# Patient Record
Sex: Male | Born: 1970 | Race: Black or African American | Hispanic: No | Marital: Married | Smoking: Current every day smoker
Health system: Southern US, Community
[De-identification: ages and names within clinical notes are randomized; demographics above are authoritative.]

## PROBLEM LIST (undated history)

## (undated) DIAGNOSIS — F32A Depression, unspecified: Secondary | ICD-10-CM

## (undated) DIAGNOSIS — I10 Essential (primary) hypertension: Secondary | ICD-10-CM

## (undated) DIAGNOSIS — F1020 Alcohol dependence, uncomplicated: Secondary | ICD-10-CM

## (undated) DIAGNOSIS — F1410 Cocaine abuse, uncomplicated: Secondary | ICD-10-CM

## (undated) DIAGNOSIS — F329 Major depressive disorder, single episode, unspecified: Secondary | ICD-10-CM

## (undated) HISTORY — PX: OTHER SURGICAL HISTORY: SHX169

## (undated) HISTORY — PX: HAND TENDON SURGERY: SHX663

## (undated) HISTORY — PX: FACIAL RECONSTRUCTION SURGERY: SHX631

---

## 2008-06-30 ENCOUNTER — Emergency Department: Payer: Self-pay | Admitting: Emergency Medicine

## 2009-08-24 ENCOUNTER — Emergency Department: Payer: Self-pay | Admitting: Internal Medicine

## 2012-03-09 ENCOUNTER — Emergency Department: Payer: Self-pay | Admitting: Emergency Medicine

## 2012-08-29 ENCOUNTER — Emergency Department (HOSPITAL_BASED_OUTPATIENT_CLINIC_OR_DEPARTMENT_OTHER): Payer: Self-pay

## 2012-08-29 ENCOUNTER — Emergency Department (HOSPITAL_BASED_OUTPATIENT_CLINIC_OR_DEPARTMENT_OTHER)
Admission: EM | Admit: 2012-08-29 | Discharge: 2012-08-29 | Disposition: A | Discharge status: Home / Self Care | Payer: Self-pay | Attending: Emergency Medicine | Admitting: Emergency Medicine

## 2012-08-29 ENCOUNTER — Encounter (HOSPITAL_BASED_OUTPATIENT_CLINIC_OR_DEPARTMENT_OTHER): Payer: Self-pay

## 2012-08-29 DIAGNOSIS — I10 Essential (primary) hypertension: Secondary | ICD-10-CM | POA: Insufficient documentation

## 2012-08-29 DIAGNOSIS — R42 Dizziness and giddiness: Secondary | ICD-10-CM | POA: Insufficient documentation

## 2012-08-29 DIAGNOSIS — F17200 Nicotine dependence, unspecified, uncomplicated: Secondary | ICD-10-CM | POA: Insufficient documentation

## 2012-08-29 DIAGNOSIS — R079 Chest pain, unspecified: Secondary | ICD-10-CM | POA: Insufficient documentation

## 2012-08-29 HISTORY — DX: Essential (primary) hypertension: I10

## 2012-08-29 LAB — CBC WITH DIFFERENTIAL/PLATELET
Basophils Absolute: 0 10*3/uL (ref 0.0–0.1)
Eosinophils Relative: 2 % (ref 0–5)
Lymphocytes Relative: 36 % (ref 12–46)
MCV: 87.6 fL (ref 78.0–100.0)
Platelets: 236 10*3/uL (ref 150–400)
RDW: 12.6 % (ref 11.5–15.5)
WBC: 8.2 10*3/uL (ref 4.0–10.5)

## 2012-08-29 LAB — D-DIMER, QUANTITATIVE: D-Dimer, Quant: <0.27 ug/mL-FEU (ref 0.00–0.48)

## 2012-08-29 LAB — BASIC METABOLIC PANEL
CO2: 23 mEq/L (ref 19–32)
Calcium: 9.6 mg/dL (ref 8.4–10.5)
GFR calc non Af Amer: 81 mL/min — ABNORMAL LOW (ref >90)
Sodium: 140 mEq/L (ref 135–145)

## 2012-08-29 LAB — TROPONIN I: Troponin I: <0.3 ng/mL (ref <0.30)

## 2012-08-29 MED ORDER — HYDROCHLOROTHIAZIDE 12.5 MG PO TABS: 25.0000 mg | ORAL_TABLET | Freq: Every day | ORAL | Status: DC

## 2012-08-29 MED ORDER — LISINOPRIL 5 MG PO TABS: 20.0000 mg | ORAL_TABLET | Freq: Every day | ORAL | Status: DC

## 2012-08-29 MED ORDER — TRAZODONE HCL 100 MG PO TABS: 100.0000 mg | ORAL_TABLET | Freq: Every day | ORAL | Status: AC

## 2012-08-29 NOTE — ED Notes (Signed)
Pt returned from radiology.

## 2012-08-29 NOTE — ED Notes (Addendum)
C/o dizziness that started approx noon-BP ws checked-elevated BP-pt is currently in [facility] for cocaine abuse-pt reports CP earlier-none at present-also requesting med for sleep

## 2012-08-29 NOTE — ED Provider Notes (Addendum)
History     CSN: [id number]  Arrival date & time 08/29/12  1503   First MD Initiated Contact with Patient 08/29/12 1536      Chief Complaint  Patient presents with  . Hypertension    (Consider location/radiation/quality/duration/timing/severity/associated sxs/prior treatment) HPI Comments: Patient comes to the ER for evaluation of high blood pressure. Patient was seen at [facility] earlier today and was found to have high blood pressure. He does have a history of hypertension, has been off all his medications for about a year. Previously took hydrochlorothiazide.  Patient reports that he has had some dizziness today. He also had mild pain in his chest with taking deep breaths earlier. He worked out earlier today, had no pain while working out, the discomfort with breathing occurred afterwards. He does not have any pain at rest. He does not feel short of breath.  Patient reports that he has been under increased stress. He has a history of insomnia hasn't slept well in the last 3 days. Previously took trazodone for this, but has been out of that medicine for a year.  Patient is a 42 y.o. male presenting with hypertension.  Hypertension Associated symptoms include chest pain.    Past Medical History  Diagnosis Date  . Hypertension     Past Surgical History  Procedure Laterality Date  . Arm surgery      No family history on file.  History  Substance Use Topics  . Smoking status: Current Every Day Smoker  . Smokeless tobacco: Not on file  . Alcohol Use: Yes      Review of Systems  Cardiovascular: Positive for chest pain.  Neurological: Positive for dizziness.  All other systems reviewed and are negative.    Allergies  Bee pollen and Penicillins  Home Medications  No current outpatient prescriptions on file.  BP 173/93  Pulse 81  Temp(Src) 98 F (36.7 C) (Oral)  Resp 20  Ht 5\' 8"  (1.727 m)  Wt 188 lb (85.276 kg)  BMI 28.59 kg/m2  SpO2 100%  Physical Exam   Constitutional: He is oriented to person, place, and time. He appears well-developed and well-nourished. No distress.  HENT:  Head: Normocephalic and atraumatic.  Right Ear: Hearing normal.  Nose: Nose normal.  Mouth/Throat: Oropharynx is clear and moist and mucous membranes are normal.  Eyes: Conjunctivae and EOM are normal. Pupils are equal, round, and reactive to light.  Neck: Normal range of motion. Neck supple.  Cardiovascular: Normal rate, regular rhythm, S1 normal and S2 normal.  Exam reveals no gallop and no friction rub.   No murmur heard. Pulmonary/Chest: Effort normal and breath sounds normal. No respiratory distress. He exhibits no tenderness.  Abdominal: Soft. Normal appearance and bowel sounds are normal. There is no hepatosplenomegaly. There is no tenderness. There is no rebound, no guarding, no tenderness at McBurney's point and negative Murphy's sign. No hernia.  Musculoskeletal: Normal range of motion.  Neurological: He is alert and oriented to person, place, and time. He has normal strength. No cranial nerve deficit or sensory deficit. Coordination normal. GCS eye subscore is 4. GCS verbal subscore is 5. GCS motor subscore is 6.  Skin: Skin is warm, dry and intact. No rash noted. No cyanosis.  Psychiatric: He has a normal mood and affect. His speech is normal and behavior is normal. Thought content normal.    ED Course  Procedures (including critical care time)  EKG:  Date: 08/29/2012  Rate: 79  Rhythm: normal sinus rhythm  QRS  Axis: normal  Intervals: normal  ST/T Wave abnormalities: normal  Conduction Disutrbances: none  Narrative Interpretation: unremarkable      Labs Reviewed  BASIC METABOLIC PANEL - Abnormal; Notable for the following:    GFR calc non Af Amer 81 (*)    All other components within normal limits  CBC WITH DIFFERENTIAL  TROPONIN I  D-DIMER, QUANTITATIVE   Dg Chest 2 View  08/29/2012  *RADIOLOGY REPORT*  Clinical Data: Hypertension.   CHEST - 2 VIEW  Comparison: None.  Findings: Heart and mediastinal contours are within normal limits. No focal opacities or effusions.  No acute bony abnormality.  IMPRESSION: No active cardiopulmonary disease.   Original Report Authenticated By: Charlett Nose, M.D.      Diagnosis: Hypertension    MDM  Patient comes to the ER for evaluation of hypertension. Patient had had some dizziness earlier but that has resolved. He also reported slight discomfort in his chest with taking deep breaths. PERC were negative, PE unlikely. Additionally d-dimer was negative. This essentially rules out PE in this patient. Patient is hypertensive, but chest pain symptoms are very atypical for coronary artery disease. EKG was unremarkable and troponin was negative. Patient reassured, no further workup necessary at this time.  Patient does report a previous history of hypertension. It was moderately elevated at arrival, but has slowly come down. He gets recycled at 149/83. Restart blood pressure control with lisinopril-hydrochlorothiazide. Patient also to be given trazodone to be used as needed at nighttime.        Gilda Crease, MD 08/29/12 1656  Gilda Crease, MD 08/29/12 [phone]

## 2012-09-22 ENCOUNTER — Encounter (HOSPITAL_BASED_OUTPATIENT_CLINIC_OR_DEPARTMENT_OTHER): Payer: Self-pay | Admitting: Emergency Medicine

## 2012-09-22 ENCOUNTER — Emergency Department (HOSPITAL_BASED_OUTPATIENT_CLINIC_OR_DEPARTMENT_OTHER)
Admission: EM | Admit: 2012-09-22 | Discharge: 2012-09-22 | Disposition: A | Discharge status: Home / Self Care | Payer: Self-pay | Attending: Emergency Medicine | Admitting: Emergency Medicine

## 2012-09-22 DIAGNOSIS — Z79899 Other long term (current) drug therapy: Secondary | ICD-10-CM | POA: Insufficient documentation

## 2012-09-22 DIAGNOSIS — K089 Disorder of teeth and supporting structures, unspecified: Secondary | ICD-10-CM | POA: Insufficient documentation

## 2012-09-22 DIAGNOSIS — R221 Localized swelling, mass and lump, neck: Secondary | ICD-10-CM | POA: Insufficient documentation

## 2012-09-22 DIAGNOSIS — F329 Major depressive disorder, single episode, unspecified: Secondary | ICD-10-CM | POA: Insufficient documentation

## 2012-09-22 DIAGNOSIS — R220 Localized swelling, mass and lump, head: Secondary | ICD-10-CM | POA: Insufficient documentation

## 2012-09-22 DIAGNOSIS — K0889 Other specified disorders of teeth and supporting structures: Secondary | ICD-10-CM

## 2012-09-22 DIAGNOSIS — F3289 Other specified depressive episodes: Secondary | ICD-10-CM | POA: Insufficient documentation

## 2012-09-22 DIAGNOSIS — I10 Essential (primary) hypertension: Secondary | ICD-10-CM | POA: Insufficient documentation

## 2012-09-22 DIAGNOSIS — F17200 Nicotine dependence, unspecified, uncomplicated: Secondary | ICD-10-CM | POA: Insufficient documentation

## 2012-09-22 HISTORY — DX: Depression, unspecified: F32.A

## 2012-09-22 HISTORY — DX: Major depressive disorder, single episode, unspecified: F32.9

## 2012-09-22 MED ORDER — OXYCODONE-ACETAMINOPHEN 5-325 MG PO TABS
2.0000 | ORAL_TABLET | Freq: Once | ORAL | Status: AC
  Administered 2012-09-22: 2 via ORAL
  Filled 2012-09-22 (×2): qty 2

## 2012-09-22 MED ORDER — CLINDAMYCIN HCL 150 MG PO CAPS: 150.0000 mg | ORAL_CAPSULE | Freq: Four times a day (QID) | ORAL | Status: AC

## 2012-09-22 NOTE — ED Provider Notes (Signed)
History     CSN: [id number]  Arrival date & time 09/22/12  0708   First MD Initiated Contact with Patient 09/22/12 [phone]      Chief Complaint  Patient presents with  . Dental Pain    Patient is a 42 y.o. male presenting with tooth pain. The history is provided by the patient.  Dental PainThe primary symptoms include mouth pain. Primary symptoms do not include fever. The symptoms began 2 days ago. The symptoms are worsening. The symptoms are new. The symptoms occur constantly.  Additional symptoms do not include: facial swelling.    Past Medical History  Diagnosis Date  . Hypertension   . Depression     Past Surgical History  Procedure Laterality Date  . Arm surgery      No family history on file.  History  Substance Use Topics  . Smoking status: Current Every Day Smoker  . Smokeless tobacco: Not on file  . Alcohol Use: Yes      Review of Systems  Constitutional: Negative for fever.  HENT: Negative for facial swelling.     Allergies  Bee pollen and Penicillins  Home Medications   Current Outpatient Rx  Name  Route  Sig  Dispense  Refill  . clindamycin (CLEOCIN) 150 MG capsule   Oral   Take 1 capsule (150 mg total) by mouth every 6 (six) hours.   28 capsule   0   . FLUoxetine HCl (PROZAC PO)   Oral   Take by mouth daily.         . hydrochlorothiazide (HYDRODIURIL) 12.5 MG tablet   Oral   Take 2 tablets (25 mg total) by mouth daily.   90 tablet   2   . lisinopril (PRINIVIL,ZESTRIL) 5 MG tablet   Oral   Take 4 tablets (20 mg total) by mouth daily.   90 tablet   2   . traZODone (DESYREL) 100 MG tablet   Oral   Take 1 tablet (100 mg total) by mouth at bedtime.   30 tablet   2     BP 137/85  Pulse 86  Temp(Src) 97.5 F (36.4 C) (Oral)  Resp 20  SpO2 95%  Physical Exam CONSTITUTIONAL: Well developed/well nourished HEAD AND FACE: Normocephalic/atraumatic EYES: EOMI/PERRL ENMT: Mucous membranes moist. Tender to right upper premolar.   No trismus.  No focal abscess noted. NECK: supple no meningeal signs CV: S1/S2 noted, no murmurs/rubs/gallops noted LUNGS: Lungs are clear to auscultation bilaterally, no apparent distress ABDOMEN: soft, nontender, no rebound or guarding NEURO: Pt is awake/alert, moves all extremitiesx4 EXTREMITIES:full ROM SKIN: warm, color normal  ED Course  Procedures   1. Pain, dental       MDM  Nursing notes including past medical history and social history reviewed and considered in documentation         Joya Gaskins, MD 09/22/12 [phone]

## 2012-09-22 NOTE — ED Notes (Addendum)
Dental pain x 2 days.  Pt from [facility].

## 2012-09-23 ENCOUNTER — Emergency Department (HOSPITAL_BASED_OUTPATIENT_CLINIC_OR_DEPARTMENT_OTHER)
Admission: EM | Admit: 2012-09-23 | Discharge: 2012-09-23 | Disposition: A | Discharge status: Home / Self Care | Payer: Self-pay | Attending: Emergency Medicine | Admitting: Emergency Medicine

## 2012-09-23 ENCOUNTER — Encounter (HOSPITAL_BASED_OUTPATIENT_CLINIC_OR_DEPARTMENT_OTHER): Payer: Self-pay | Admitting: *Deleted

## 2012-09-23 DIAGNOSIS — K047 Periapical abscess without sinus: Secondary | ICD-10-CM

## 2012-09-23 DIAGNOSIS — F3289 Other specified depressive episodes: Secondary | ICD-10-CM | POA: Insufficient documentation

## 2012-09-23 DIAGNOSIS — Z79899 Other long term (current) drug therapy: Secondary | ICD-10-CM | POA: Insufficient documentation

## 2012-09-23 DIAGNOSIS — F17200 Nicotine dependence, unspecified, uncomplicated: Secondary | ICD-10-CM | POA: Insufficient documentation

## 2012-09-23 DIAGNOSIS — K044 Acute apical periodontitis of pulpal origin: Secondary | ICD-10-CM | POA: Insufficient documentation

## 2012-09-23 DIAGNOSIS — F329 Major depressive disorder, single episode, unspecified: Secondary | ICD-10-CM | POA: Insufficient documentation

## 2012-09-23 DIAGNOSIS — K0889 Other specified disorders of teeth and supporting structures: Secondary | ICD-10-CM

## 2012-09-23 DIAGNOSIS — K089 Disorder of teeth and supporting structures, unspecified: Secondary | ICD-10-CM | POA: Insufficient documentation

## 2012-09-23 DIAGNOSIS — F1410 Cocaine abuse, uncomplicated: Secondary | ICD-10-CM | POA: Insufficient documentation

## 2012-09-23 DIAGNOSIS — I10 Essential (primary) hypertension: Secondary | ICD-10-CM | POA: Insufficient documentation

## 2012-09-23 DIAGNOSIS — F1020 Alcohol dependence, uncomplicated: Secondary | ICD-10-CM | POA: Insufficient documentation

## 2012-09-23 HISTORY — DX: Cocaine abuse, uncomplicated: F14.10

## 2012-09-23 HISTORY — DX: Alcohol dependence, uncomplicated: F10.20

## 2012-09-23 MED ORDER — TRAMADOL HCL 50 MG PO TABS: 50.0000 mg | ORAL_TABLET | Freq: Four times a day (QID) | ORAL | Status: AC | PRN

## 2012-09-23 MED ORDER — TRAMADOL HCL 50 MG PO TABS
50.0000 mg | ORAL_TABLET | Freq: Once | ORAL | Status: AC
  Administered 2012-09-23: 50 mg via ORAL
  Filled 2012-09-23: qty 1

## 2012-09-23 NOTE — ED Provider Notes (Signed)
History     CSN: [id number]  Arrival date & time 09/23/12  1149   First MD Initiated Contact with Patient 09/23/12 1208      Chief Complaint  Patient presents with  . Dental Pain    (Consider location/radiation/quality/duration/timing/severity/associated sxs/prior treatment) HPI Comments: 42 year old male presents to the emergency department complaining of right-sided dental pain x3 days. Patient was seen here in the emergency department yesterday and was given a prescription for Vicodin, however he is in a treatment program for narcotic addiction and is not supposed to accept narcotic medications. He was also placed on an antibiotic which he is taking to call the dentist, however states to see him when he was there earlier today. States the pain is worse today than yesterday, described as sharp rated 10 out of 10. Pain worse when trying to chew or with cold exposure. Denies fever, chills, difficulty swallowing.  Patient is a 42 y.o. male presenting with tooth pain. The history is provided by the patient.  Dental PainPrimary symptoms do not include fever.  Additional symptoms do not include: trouble swallowing.    Past Medical History  Diagnosis Date  . Hypertension   . Depression   . Alcoholism   . Cocaine abuse     Past Surgical History  Procedure Laterality Date  . Arm surgery      No family history on file.  History  Substance Use Topics  . Smoking status: Current Every Day Smoker  . Smokeless tobacco: Not on file  . Alcohol Use: Yes      Review of Systems  Constitutional: Negative for fever and chills.  HENT: Positive for dental problem. Negative for trouble swallowing.   All other systems reviewed and are negative.    Allergies  Bee pollen and Penicillins  Home Medications   Current Outpatient Rx  Name  Route  Sig  Dispense  Refill  . clindamycin (CLEOCIN) 150 MG capsule   Oral   Take 1 capsule (150 mg total) by mouth every 6 (six) hours.   28  capsule   0   . FLUoxetine HCl (PROZAC PO)   Oral   Take by mouth daily.         . hydrochlorothiazide (HYDRODIURIL) 12.5 MG tablet   Oral   Take 2 tablets (25 mg total) by mouth daily.   90 tablet   2   . lisinopril (PRINIVIL,ZESTRIL) 5 MG tablet   Oral   Take 4 tablets (20 mg total) by mouth daily.   90 tablet   2   . traMADol (ULTRAM) 50 MG tablet   Oral   Take 1 tablet (50 mg total) by mouth every 6 (six) hours as needed for pain.   15 tablet   0   . traZODone (DESYREL) 100 MG tablet   Oral   Take 1 tablet (100 mg total) by mouth at bedtime.   30 tablet   2     BP 150/97  Pulse 86  Temp(Src) 97.9 F (36.6 C) (Oral)  Resp 22  Wt 188 lb (85.276 kg)  BMI 28.59 kg/m2  SpO2 100%  Physical Exam  Nursing note and vitals reviewed. Constitutional: He is oriented to person, place, and time. He appears well-developed and well-nourished. No distress.  HENT:  Head: Normocephalic and atraumatic. No trismus in the jaw.  Mouth/Throat: Uvula is midline. Abnormal dentition. No dental abscesses.    TTP of upper right last 3 molars with mild surrounding erythema. No edema or abscess.  Eyes: Conjunctivae are normal.  Neck: Normal range of motion. Neck supple.  Cardiovascular: Normal rate, regular rhythm and normal heart sounds.   Pulmonary/Chest: Effort normal and breath sounds normal. No respiratory distress.  Musculoskeletal: Normal range of motion. He exhibits no edema.  Lymphadenopathy:       Head (right side): No submental and no submandibular adenopathy present.       Head (left side): No submental and no submandibular adenopathy present.    He has no cervical adenopathy.  Neurological: He is alert and oriented to person, place, and time.  Skin: Skin is warm and dry. No erythema.  Psychiatric: He has a normal mood and affect. His behavior is normal.    ED Course  Procedures (including critical care time)  Labs Reviewed - No data to display No results  found.   1. Pain, dental   2. Dental infection       MDM   Dental pain associated with dental infection. No evidence of dental abscess. Patient is afebrile, non toxic appearing and swallowing secretions well. I gave patient referral to dentist and stressed the importance of dental follow up for ultimate management of dental pain. I will give him tramadol rather than narcotic pain medication. He is on clindamycin which was prescribed yesterday. Patient voices understanding and is agreeable to plan.         Trevor Mace, PA-C 09/23/12 1229

## 2012-09-23 NOTE — ED Notes (Signed)
Toothache x 3 days. States he has been an inpatient for the past 30 days at [facility] for cocaine and alcohol addiction. Was seen here yesterday and given Vicodin but failed to tell MD he was not suppose to accept narcotics.

## 2012-09-23 NOTE — ED Provider Notes (Signed)
Medical screening examination/treatment/procedure(s) were performed by non-physician practitioner and as supervising physician I was immediately available for consultation/collaboration.   Charles B. Bernette Mayers, MD 09/23/12 [phone]

## 2012-10-04 ENCOUNTER — Encounter (HOSPITAL_BASED_OUTPATIENT_CLINIC_OR_DEPARTMENT_OTHER): Payer: Self-pay | Admitting: *Deleted

## 2012-10-04 ENCOUNTER — Emergency Department (HOSPITAL_BASED_OUTPATIENT_CLINIC_OR_DEPARTMENT_OTHER)
Admission: EM | Admit: 2012-10-04 | Discharge: 2012-10-04 | Disposition: A | Discharge status: Home / Self Care | Payer: Self-pay | Attending: Emergency Medicine | Admitting: Emergency Medicine

## 2012-10-04 DIAGNOSIS — F17200 Nicotine dependence, unspecified, uncomplicated: Secondary | ICD-10-CM | POA: Insufficient documentation

## 2012-10-04 DIAGNOSIS — F1021 Alcohol dependence, in remission: Secondary | ICD-10-CM | POA: Insufficient documentation

## 2012-10-04 DIAGNOSIS — Z79899 Other long term (current) drug therapy: Secondary | ICD-10-CM | POA: Insufficient documentation

## 2012-10-04 DIAGNOSIS — I10 Essential (primary) hypertension: Secondary | ICD-10-CM | POA: Insufficient documentation

## 2012-10-04 DIAGNOSIS — K089 Disorder of teeth and supporting structures, unspecified: Secondary | ICD-10-CM | POA: Insufficient documentation

## 2012-10-04 DIAGNOSIS — F3289 Other specified depressive episodes: Secondary | ICD-10-CM | POA: Insufficient documentation

## 2012-10-04 DIAGNOSIS — R51 Headache: Secondary | ICD-10-CM | POA: Insufficient documentation

## 2012-10-04 DIAGNOSIS — F329 Major depressive disorder, single episode, unspecified: Secondary | ICD-10-CM | POA: Insufficient documentation

## 2012-10-04 DIAGNOSIS — K0889 Other specified disorders of teeth and supporting structures: Secondary | ICD-10-CM

## 2012-10-04 MED ORDER — HYDROCHLOROTHIAZIDE 25 MG PO TABS: 25.0000 mg | ORAL_TABLET | Freq: Every day | ORAL | Status: AC

## 2012-10-04 MED ORDER — TRAMADOL HCL 50 MG PO TABS: 50.0000 mg | ORAL_TABLET | Freq: Four times a day (QID) | ORAL | Status: AC | PRN

## 2012-10-04 NOTE — ED Provider Notes (Signed)
History     CSN: [id number]  Arrival date & time 10/04/12  1332   First MD Initiated Contact with Patient 10/04/12 1347      Chief Complaint  Patient presents with  . Medication Refill    (Consider location/radiation/quality/duration/timing/severity/associated sxs/prior treatment) HPI Comments: Patient comes to the ER with complaints of headaches which he feels is secondary to lisinopril. Patient was placed on lisinopril and hydrochlorothiazide for her previous visit for accelerated hypertension. Patient reports that every time he takes the lisinopril, he gets a headache. He has stopped taking it 2 times over last 2 weeks and on the days that he doesn't take it, the headaches do not occur. He has not had a headache today, hasn't had any in 3 days since he stopped taking the lisinopril. Patient has not had any chest pain, shortness of breath. He does still continue to complain of pain on his right upper molar. Was seen previously, treated with antibiotics and Ultram. He reports that the Ultram helped, but he has run out.   Past Medical History  Diagnosis Date  . Hypertension   . Depression   . Alcoholism   . Cocaine abuse     Past Surgical History  Procedure Laterality Date  . Arm surgery      No family history on file.  History  Substance Use Topics  . Smoking status: Current Every Day Smoker  . Smokeless tobacco: Not on file  . Alcohol Use: Yes      Review of Systems  Constitutional: Negative for fever.  HENT: Positive for dental problem.   Respiratory: Negative for shortness of breath.   Cardiovascular: Negative for chest pain.  Neurological: Positive for headaches.  All other systems reviewed and are negative.    Allergies  Bee pollen and Penicillins  Home Medications   Current Outpatient Rx  Name  Route  Sig  Dispense  Refill  . clindamycin (CLEOCIN) 150 MG capsule   Oral   Take 1 capsule (150 mg total) by mouth every 6 (six) hours.   28 capsule    0   . FLUoxetine HCl (PROZAC PO)   Oral   Take by mouth daily.         . hydrochlorothiazide (HYDRODIURIL) 12.5 MG tablet   Oral   Take 2 tablets (25 mg total) by mouth daily.   90 tablet   2   . lisinopril (PRINIVIL,ZESTRIL) 5 MG tablet   Oral   Take 4 tablets (20 mg total) by mouth daily.   90 tablet   2   . traMADol (ULTRAM) 50 MG tablet   Oral   Take 1 tablet (50 mg total) by mouth every 6 (six) hours as needed for pain.   15 tablet   0   . traZODone (DESYREL) 100 MG tablet   Oral   Take 1 tablet (100 mg total) by mouth at bedtime.   30 tablet   2     BP 143/88  Pulse 74  Temp(Src) 98.3 F (36.8 C) (Oral)  Resp 20  Wt 188 lb (85.276 kg)  BMI 28.59 kg/m2  SpO2 99%  Physical Exam  Constitutional: He is oriented to person, place, and time. He appears well-developed and well-nourished. No distress.  HENT:  Head: Normocephalic and atraumatic.  Right Ear: Hearing normal.  Nose: Nose normal.  Mouth/Throat: Oropharynx is clear and moist and mucous membranes are normal.  Eyes: Conjunctivae and EOM are normal. Pupils are equal, round, and reactive to light.  Neck: Normal range of motion. Neck supple.  Cardiovascular: Normal rate, regular rhythm, S1 normal and S2 normal.  Exam reveals no gallop and no friction rub.   No murmur heard. Pulmonary/Chest: Effort normal and breath sounds normal. No respiratory distress. He exhibits no tenderness.  Abdominal: Soft. Normal appearance and bowel sounds are normal. There is no hepatosplenomegaly. There is no tenderness. There is no rebound, no guarding, no tenderness at McBurney's point and negative Murphy's sign. No hernia.  Musculoskeletal: Normal range of motion.  Neurological: He is alert and oriented to person, place, and time. He has normal strength. No cranial nerve deficit or sensory deficit. Coordination normal. GCS eye subscore is 4. GCS verbal subscore is 5. GCS motor subscore is 6.  Skin: Skin is warm, dry and  intact. No rash noted. No cyanosis.  Psychiatric: He has a normal mood and affect. His speech is normal and behavior is normal. Thought content normal.    ED Course  Procedures (including critical care time)  Labs Reviewed - No data to display No results found.   Diagnoses: 1. Toothache 2. Headaches. Hypertension    MDM  Patient reports intolerance of the lisinopril. Patient has been off of the lisinopril for several days and his blood pressure is only mildly elevated. He is asking if he can stop lisinopril terminally. I have recommended that he stop it for a period of one to 2 weeks and check his blood pressure daily. If his pressure is staying in the 140/90 range, he may need a second agent, in place of the lisinopril. He is to return immediately to the ER his blood pressure is higher than that.        Gilda Crease, MD 10/04/12 1355

## 2012-10-04 NOTE — ED Notes (Addendum)
States we started him on Lisinopril and he is getting headaches from it. Wants to see if it is ok to stop it and take HCTZ only. He also would like a refill on his Tramadol Rx. He is an inpatient at [facility].

## 2015-02-24 ENCOUNTER — Encounter: Payer: Self-pay | Admitting: *Deleted

## 2015-02-24 ENCOUNTER — Emergency Department
Admission: EM | Admit: 2015-02-24 | Discharge: 2015-02-24 | Disposition: A | Discharge status: Home / Self Care | Payer: Self-pay | Attending: Emergency Medicine | Admitting: Emergency Medicine

## 2015-02-24 DIAGNOSIS — Z720 Tobacco use: Secondary | ICD-10-CM | POA: Insufficient documentation

## 2015-02-24 DIAGNOSIS — K649 Unspecified hemorrhoids: Secondary | ICD-10-CM

## 2015-02-24 DIAGNOSIS — Z7952 Long term (current) use of systemic steroids: Secondary | ICD-10-CM | POA: Insufficient documentation

## 2015-02-24 DIAGNOSIS — Z79899 Other long term (current) drug therapy: Secondary | ICD-10-CM | POA: Insufficient documentation

## 2015-02-24 DIAGNOSIS — I10 Essential (primary) hypertension: Secondary | ICD-10-CM | POA: Insufficient documentation

## 2015-02-24 DIAGNOSIS — K644 Residual hemorrhoidal skin tags: Secondary | ICD-10-CM | POA: Insufficient documentation

## 2015-02-24 DIAGNOSIS — Z880 Allergy status to penicillin: Secondary | ICD-10-CM | POA: Insufficient documentation

## 2015-02-24 MED ORDER — HYDROCORTISONE 2.5 % RE CREA
TOPICAL_CREAM | RECTAL | Status: AC
Start: 2015-02-24 — End: 2016-02-24

## 2015-02-24 MED ORDER — OXYCODONE-ACETAMINOPHEN 5-325 MG PO TABS: 1.0000 | ORAL_TABLET | ORAL | Status: AC | PRN

## 2015-02-24 MED ORDER — IBUPROFEN 800 MG PO TABS: 800.0000 mg | ORAL_TABLET | Freq: Three times a day (TID) | ORAL | Status: DC | PRN

## 2015-02-24 MED ORDER — HYDROCORTISONE ACETATE 25 MG RE SUPP: 25.0000 mg | Freq: Two times a day (BID) | RECTAL | Status: AC

## 2015-02-24 NOTE — Discharge Instructions (Signed)
Disposable Sitz Bath °A disposable sitz bath is a plastic basin that fits over the toilet. A bag is hung above the toilet and is connected to a tube that opens into the disposable sitz bath. The bag is filled with warm water that can flow into the basin through the tube.  °HOW TO USE A DISPOSABLE SITZ BATH °· Close the clamp on the tubing before filling the bag with water. This is to prevent leakage. °· Fill the sitz bath basin and the plastic bag with warm water. °· Place the filled basin on the toilet with the seat raised. Make sure the overflow opening is facing toward the back of the toilet. °· Hang the filled plastic bag overhead on a hook or towel rack close to the toilet. When the bag is unclamped, a steady stream of water will flow from the bag, through the tubing, and into the basin. °· Attach the tubing to the opening on the basin. °· Sit on the basin positioned on the toilet seat and release the clamp. This will allow warm water to flush the area around your genitals and anus (perineum). °· Remain sitting on the basin for approximately 15 to 20 minutes. °· Stand up and pat the perineum area dry. If needed, apply clean bandages (dressings) to the affected area. °· Tip the basin into the toilet to remove any remaining water and flush the toilet. °· Wash the basin with warm water and soap. Let it dry in the sink. °· Store the basin and tubing in a clean, dry area. °· Wash your hands with soap and water. °SEEK MEDICAL CARE IF: °You get worse instead of better. Stop the sitz baths if you get worse. °MAKE SURE YOU: °· Understand these instructions. °· Will watch your condition. °· Will get help right away if you are not doing well or get worse. °Document Released: 12/12/2011 Document Revised: 03/06/2012 Document Reviewed: 12/12/2011 °Exit[employer]® Patient Information ©2015 [employer]. This information is not intended to replace advice given to you by your health care provider. Make sure you discuss any questions  you have with your health care provider. ° °Hemorrhoids °Hemorrhoids are swollen veins around the rectum or anus. There are two types of hemorrhoids:  °· Internal hemorrhoids. These occur in the veins just inside the rectum. They may poke through to the outside and become irritated and painful. °· External hemorrhoids. These occur in the veins outside the anus and can be felt as a painful swelling or hard lump near the anus. °CAUSES °· Pregnancy.   °· Obesity.   °· Constipation or diarrhea.   °· Straining to have a bowel movement.   °· Sitting for long periods on the toilet. °· Heavy lifting or other activity that caused you to strain. °· Anal intercourse. °SYMPTOMS  °· Pain.   °· Anal itching or irritation.   °· Rectal bleeding.   °· Fecal leakage.   °· Anal swelling.   °· One or more lumps around the anus.   °DIAGNOSIS  °Your caregiver may be able to diagnose hemorrhoids by visual examination. Other examinations or tests that may be performed include:  °· Examination of the rectal area with a gloved hand (digital rectal exam).   °· Examination of anal canal using a small tube (scope).   °· A blood test if you have lost a significant amount of blood. °· A test to look inside the colon (sigmoidoscopy or colonoscopy). °TREATMENT °Most hemorrhoids can be treated at home. However, if symptoms do not seem to be getting better or if you have   a lot of rectal bleeding, your caregiver may perform a procedure to help make the hemorrhoids get smaller or remove them completely. Possible treatments include:  °· Placing a rubber band at the base of the hemorrhoid to cut off the circulation (rubber band ligation).   °· Injecting a chemical to shrink the hemorrhoid (sclerotherapy).   °· Using a tool to burn the hemorrhoid (infrared light therapy).   °· Surgically removing the hemorrhoid (hemorrhoidectomy).   °· Stapling the hemorrhoid to block blood flow to the tissue (hemorrhoid stapling).   °HOME CARE INSTRUCTIONS  °· Eat foods  with fiber, such as whole grains, beans, nuts, fruits, and vegetables. Ask your doctor about taking products with added fiber in them (fiber supplements). °· Increase fluid intake. Drink enough water and fluids to keep your urine clear or pale yellow.   °· Exercise regularly.   °· Go to the bathroom when you have the urge to have a bowel movement. Do not wait.   °· Avoid straining to have bowel movements.   °· Keep the anal area dry and clean. Use wet toilet paper or moist towelettes after a bowel movement.   °· Medicated creams and suppositories may be used or applied as directed.   °· Only take over-the-counter or prescription medicines as directed by your caregiver.   °· Take warm sitz baths for 15-20 minutes, 3-4 times a day to ease pain and discomfort.   °· Place ice packs on the hemorrhoids if they are tender and swollen. Using ice packs between sitz baths may be helpful.   °¨ Put ice in a plastic bag.   °¨ Place a towel between your skin and the bag.   °¨ Leave the ice on for 15-20 minutes, 3-4 times a day.   °· Do not use a donut-shaped pillow or sit on the toilet for long periods. This increases blood pooling and pain.   °SEEK MEDICAL CARE IF: °· You have increasing pain and swelling that is not controlled by treatment or medicine. °· You have uncontrolled bleeding. °· You have difficulty or you are unable to have a bowel movement. °· You have pain or inflammation outside the area of the hemorrhoids. °MAKE SURE YOU: °· Understand these instructions. °· Will watch your condition. °· Will get help right away if you are not doing well or get worse. °Document Released: 06/09/2000 Document Revised: 05/29/2012 Document Reviewed: 04/16/2012 °ExitCare® Patient Information ©2015 [employer]. This information is not intended to replace advice given to you by your health care provider. Make sure you discuss any questions you have with your health care provider. ° °

## 2015-02-24 NOTE — ED Provider Notes (Signed)
[facility] Emergency Department Provider Note  ____________________________________________  Time seen: Approximately 3:13 PM  I have reviewed the triage vital signs and the nursing notes.   HISTORY  Chief Complaint Hemorrhoids    HPI David Ray is a 44 y.o. male who presents for evaluation of hemorrhoids. She states he's had this for the past 2 days. This is not the first time for his hemorrhoids.   Past Medical History  Diagnosis Date  . Hypertension   . Depression   . Alcoholism   . Cocaine abuse     There are no active problems to display for this patient.   Past Surgical History  Procedure Laterality Date  . Arm surgery    . Hand tendon surgery Left   . Facial reconstruction surgery      Current Outpatient Rx  Name  Route  Sig  Dispense  Refill  . clindamycin (CLEOCIN) 150 MG capsule   Oral   Take 1 capsule (150 mg total) by mouth every 6 (six) hours.   28 capsule   0   . FLUoxetine HCl (PROZAC PO)   Oral   Take by mouth daily.         . hydrochlorothiazide (HYDRODIURIL) 25 MG tablet   Oral   Take 1 tablet (25 mg total) by mouth daily.   90 tablet   2   . hydrocortisone (ANUSOL-HC) 2.5 % rectal cream      Apply rectally 2 times daily   30 g   1   . hydrocortisone (ANUSOL-HC) 25 MG suppository   Rectal   Place 1 suppository (25 mg total) rectally 2 (two) times daily.   12 suppository   0   . ibuprofen (ADVIL,MOTRIN) 800 MG tablet   Oral   Take 1 tablet (800 mg total) by mouth every 8 (eight) hours as needed.   30 tablet   0   . oxyCODONE-acetaminophen (ROXICET) 5-325 MG per tablet   Oral   Take 1-2 tablets by mouth every 4 (four) hours as needed for severe pain.   15 tablet   0   . traMADol (ULTRAM) 50 MG tablet   Oral   Take 1 tablet (50 mg total) by mouth every 6 (six) hours as needed for pain.   15 tablet   0   . traMADol (ULTRAM) 50 MG tablet   Oral   Take 1 tablet (50 mg total) by mouth every  6 (six) hours as needed for pain.   15 tablet   0   . traZODone (DESYREL) 100 MG tablet   Oral   Take 1 tablet (100 mg total) by mouth at bedtime.   30 tablet   2     Allergies Bee pollen and Penicillins  No family history on file.  Social History Social History  Substance Use Topics  . Smoking status: Current Every Day Smoker  . Smokeless tobacco: None  . Alcohol Use: No    Review of Systems Constitutional: No fever/chills Eyes: No visual changes. ENT: No sore throat. Cardiovascular: Denies chest pain. Respiratory: Denies shortness of breath. Gastrointestinal: No abdominal pain.  No nausea, no vomiting.  No diarrhea.  No constipation. Genitourinary: Positive for hemorrhoids Musculoskeletal: Negative for back pain. Skin: Negative for rash. Neurological: Negative for headaches, focal weakness or numbness.  10-point ROS otherwise negative.  ____________________________________________   PHYSICAL EXAM:  VITAL SIGNS: ED Triage Vitals  Enc Vitals Group     BP 02/24/15 1500 168/104 mmHg  Pulse Rate 02/24/15 1500 89     Resp 02/24/15 1500 18     Temp 02/24/15 1500 97.5 F (36.4 C)     Temp Source 02/24/15 1500 Oral     SpO2 02/24/15 1500 98 %     Weight 02/24/15 1500 200 lb (90.719 kg)     Height 02/24/15 1500 5\' 8"  (1.727 m)     Head Cir --      Peak Flow --      Pain Score 02/24/15 1501 8     Pain Loc --      Pain Edu? --      Excl. in GC? --     Constitutional: Alert and oriented. Well appearing and in no acute distress. Eyes: Conjunctivae are normal. PERRL. EOMI. Head: Atraumatic. Nose: No congestion/rhinnorhea. Mouth/Throat: Mucous membranes are moist.  Oropharynx non-erythematous. Neck: No stridor.   Cardiovascular: Normal rate, regular rhythm. Grossly normal heart sounds.  Good peripheral circulation. Respiratory: Normal respiratory effort.  No retractions. Lungs CTAB. Gastrointestinal: Soft and nontender. No distention. No abdominal bruits.  No CVA tenderness. Positive nonthrombosed external hemorrhoid noted. Musculoskeletal: No lower extremity tenderness nor edema.  No joint effusions. Neurologic:  Normal speech and language. No gross focal neurologic deficits are appreciated. No gait instability. Skin:  Skin is warm, dry and intact. No rash noted. Psychiatric: Mood and affect are normal. Speech and behavior are normal.  ____________________________________________   LABS (all labs ordered are listed, but only abnormal results are displayed)  Labs Reviewed - No data to display ____________________________________________    PROCEDURES  Procedure(s) performed: None  Critical Care performed: No  ____________________________________________   INITIAL IMPRESSION / ASSESSMENT AND PLAN / ED COURSE  Pertinent labs & imaging results that were available during my care of the patient were reviewed by me and considered in my medical decision making (see chart for details).  External hemorrhoid. Rx given for Anusol HC cream and suppositories. Patient follow-up with PCP or surgery on-call symptoms get worse. Sitz baths encouraged. Patient voices no other emergency medical complaints at this time. ____________________________________________   FINAL CLINICAL IMPRESSION(S) / ED DIAGNOSES  Final diagnoses:  Hemorrhoids, unspecified hemorrhoid type      Evangeline Dakin, PA-C 02/24/15 1720  Minna Antis, MD 02/24/15 [phone]

## 2015-02-24 NOTE — ED Notes (Signed)
Pt discharged home after verbalizing understanding of discharge instructions; nad noted. 

## 2015-02-24 NOTE — ED Notes (Signed)
Patient c/o painful hemorrhoids for two days. Patients denies bleeding at this time.

## 2017-07-08 ENCOUNTER — Encounter: Payer: Self-pay | Admitting: Emergency Medicine

## 2017-07-08 ENCOUNTER — Emergency Department
Admission: EM | Admit: 2017-07-08 | Discharge: 2017-07-08 | Disposition: A | Discharge status: Other Acute Inpatient Hospital | Attending: Emergency Medicine | Admitting: Emergency Medicine

## 2017-07-08 ENCOUNTER — Emergency Department

## 2017-07-08 ENCOUNTER — Ambulatory Visit (HOSPITAL_COMMUNITY)
Admission: AD | Admit: 2017-07-08 | Discharge: 2017-07-08 | Disposition: A | Discharge status: Home / Self Care | Source: Other Acute Inpatient Hospital | Attending: Emergency Medicine | Admitting: Emergency Medicine

## 2017-07-08 DIAGNOSIS — Z79899 Other long term (current) drug therapy: Secondary | ICD-10-CM | POA: Insufficient documentation

## 2017-07-08 DIAGNOSIS — T79A9XA Traumatic compartment syndrome of other sites, initial encounter: Secondary | ICD-10-CM | POA: Diagnosis not present

## 2017-07-08 DIAGNOSIS — S0993XA Unspecified injury of face, initial encounter: Secondary | ICD-10-CM | POA: Diagnosis present

## 2017-07-08 DIAGNOSIS — S0232XB Fracture of orbital floor, left side, initial encounter for open fracture: Secondary | ICD-10-CM

## 2017-07-08 DIAGNOSIS — Y92149 Unspecified place in prison as the place of occurrence of the external cause: Secondary | ICD-10-CM | POA: Diagnosis not present

## 2017-07-08 DIAGNOSIS — I10 Essential (primary) hypertension: Secondary | ICD-10-CM | POA: Diagnosis not present

## 2017-07-08 DIAGNOSIS — F17200 Nicotine dependence, unspecified, uncomplicated: Secondary | ICD-10-CM | POA: Insufficient documentation

## 2017-07-08 DIAGNOSIS — S0181XA Laceration without foreign body of other part of head, initial encounter: Secondary | ICD-10-CM | POA: Diagnosis not present

## 2017-07-08 DIAGNOSIS — Y999 Unspecified external cause status: Secondary | ICD-10-CM | POA: Insufficient documentation

## 2017-07-08 DIAGNOSIS — H40052 Ocular hypertension, left eye: Secondary | ICD-10-CM | POA: Diagnosis not present

## 2017-07-08 DIAGNOSIS — Y939 Activity, unspecified: Secondary | ICD-10-CM | POA: Insufficient documentation

## 2017-07-08 DIAGNOSIS — S098XXA Other specified injuries of head, initial encounter: Secondary | ICD-10-CM | POA: Insufficient documentation

## 2017-07-08 LAB — ETHANOL

## 2017-07-08 LAB — CBC WITH DIFFERENTIAL/PLATELET
BASOS ABS: 0.1 10*3/uL (ref 0–0.1)
BASOS PCT: 0 %
Eosinophils Absolute: 0 10*3/uL (ref 0–0.7)
Eosinophils Relative: 0 %
HEMATOCRIT: 43.8 % (ref 40.0–52.0)
Hemoglobin: 14.9 g/dL (ref 13.0–18.0)
Lymphocytes Relative: 6 %
Lymphs Abs: 1.1 10*3/uL (ref 1.0–3.6)
MCH: 30.6 pg (ref 26.0–34.0)
MCHC: 34 g/dL (ref 32.0–36.0)
MCV: 89.8 fL (ref 80.0–100.0)
MONO ABS: 0.9 10*3/uL (ref 0.2–1.0)
Monocytes Relative: 4 %
NEUTROS ABS: 18.4 10*3/uL — AB (ref 1.4–6.5)
Neutrophils Relative %: 90 %
PLATELETS: 292 10*3/uL (ref 150–440)
RBC: 4.88 MIL/uL (ref 4.40–5.90)
RDW: 13.8 % (ref 11.5–14.5)
WBC: 20.5 10*3/uL — ABNORMAL HIGH (ref 3.8–10.6)

## 2017-07-08 LAB — COMPREHENSIVE METABOLIC PANEL
ALBUMIN: 4 g/dL (ref 3.5–5.0)
ALK PHOS: 58 U/L (ref 38–126)
ALT: 25 U/L (ref 17–63)
ANION GAP: 9 (ref 5–15)
AST: 30 U/L (ref 15–41)
BILIRUBIN TOTAL: 0.5 mg/dL (ref 0.3–1.2)
BUN: 16 mg/dL (ref 6–20)
CALCIUM: 9.3 mg/dL (ref 8.9–10.3)
CO2: 28 mmol/L (ref 22–32)
Chloride: 101 mmol/L (ref 101–111)
Creatinine, Ser: 1.16 mg/dL (ref 0.61–1.24)
GFR calc Af Amer: >60 mL/min (ref >60)
GFR calc non Af Amer: >60 mL/min (ref >60)
GLUCOSE: 108 mg/dL — AB (ref 65–99)
Potassium: 3.8 mmol/L (ref 3.5–5.1)
Sodium: 138 mmol/L (ref 135–145)
TOTAL PROTEIN: 7.5 g/dL (ref 6.5–8.1)

## 2017-07-08 MED ORDER — OXYCODONE-ACETAMINOPHEN 5-325 MG PO TABS
ORAL_TABLET | ORAL | Status: AC
  Administered 2017-07-08: 2 via ORAL
  Filled 2017-07-08: qty 2

## 2017-07-08 MED ORDER — NICOTINE 7 MG/24HR TD PT24
1.00 | MEDICATED_PATCH | TRANSDERMAL | Status: DC
Start: 2017-07-09 — End: 2017-07-08

## 2017-07-08 MED ORDER — DEXAMETHASONE SODIUM PHOSPHATE 10 MG/ML IJ SOLN
15.0000 mg | Freq: Once | INTRAMUSCULAR | Status: AC
  Administered 2017-07-08: 15 mg via INTRAVENOUS
  Filled 2017-07-08: qty 2

## 2017-07-08 MED ORDER — LIDOCAINE-EPINEPHRINE (PF) 1 %-1:200000 IJ SOLN
INTRAMUSCULAR | Status: AC
  Administered 2017-07-08: 30 mL
  Filled 2017-07-08: qty 30

## 2017-07-08 MED ORDER — ONDANSETRON HCL 4 MG/2ML IJ SOLN
4.0000 mg | Freq: Once | INTRAMUSCULAR | Status: AC
Start: 2017-07-08 — End: 2017-07-08
  Administered 2017-07-08: 4 mg via INTRAVENOUS

## 2017-07-08 MED ORDER — IBUPROFEN 600 MG PO TABS
600.00 mg | ORAL_TABLET | ORAL | Status: DC
Start: ? — End: 2017-07-08

## 2017-07-08 MED ORDER — POLYETHYLENE GLYCOL 3350 17 G PO PACK
17.00 | PACK | ORAL | Status: DC
Start: ? — End: 2017-07-08

## 2017-07-08 MED ORDER — CEFAZOLIN SODIUM-DEXTROSE 1-4 GM/50ML-% IV SOLN
1.0000 g | Freq: Once | INTRAVENOUS | Status: AC
  Administered 2017-07-08: 1 g via INTRAVENOUS

## 2017-07-08 MED ORDER — ONDANSETRON HCL 4 MG/2ML IJ SOLN
INTRAMUSCULAR | Status: AC
  Filled 2017-07-08: qty 2

## 2017-07-08 MED ORDER — OXYCODONE-ACETAMINOPHEN 5-325 MG PO TABS: 2.0000 | ORAL_TABLET | Freq: Once | ORAL | Status: AC

## 2017-07-08 MED ORDER — LIDOCAINE-EPINEPHRINE 1 %-1:100000 IJ SOLN: 10.0000 mL | Freq: Once | INTRAMUSCULAR | Status: DC

## 2017-07-08 MED ORDER — IBUPROFEN 600 MG PO TABS: 600.0000 mg | ORAL_TABLET | Freq: Once | ORAL | Status: AC

## 2017-07-08 MED ORDER — ACETAMINOPHEN 500 MG PO TABS: 1000.0000 mg | ORAL_TABLET | Freq: Once | ORAL | Status: DC

## 2017-07-08 MED ORDER — GENERIC EXTERNAL MEDICATION
30.00 | Status: DC
Start: ? — End: 2017-07-08

## 2017-07-08 MED ORDER — TETRACAINE HCL 0.5 % OP SOLN
2.0000 [drp] | Freq: Once | OPHTHALMIC | Status: AC
  Administered 2017-07-08: 2 [drp] via OPHTHALMIC
  Filled 2017-07-08: qty 4

## 2017-07-08 MED ORDER — SODIUM CHLORIDE 0.9 % IV BOLUS (SEPSIS)
1000.0000 mL | Freq: Once | INTRAVENOUS | Status: AC
  Administered 2017-07-08: 1000 mL via INTRAVENOUS

## 2017-07-08 MED ORDER — CLINDAMYCIN HCL 150 MG PO CAPS
300.00 mg | ORAL_CAPSULE | ORAL | Status: DC
Start: 2017-07-08 — End: 2017-07-08

## 2017-07-08 MED ORDER — CEFAZOLIN SODIUM 1 G IJ SOLR
INTRAMUSCULAR | Status: AC
  Filled 2017-07-08: qty 10

## 2017-07-08 MED ORDER — IBUPROFEN 600 MG PO TABS
ORAL_TABLET | ORAL | Status: AC
  Administered 2017-07-08: 600 mg via ORAL
  Filled 2017-07-08: qty 1

## 2017-07-08 MED ORDER — ACETAMINOPHEN 325 MG PO TABS
650.00 mg | ORAL_TABLET | ORAL | Status: DC
Start: ? — End: 2017-07-08

## 2017-07-08 MED ORDER — IBUPROFEN 600 MG PO TABS: 600.0000 mg | ORAL_TABLET | Freq: Once | ORAL | Status: DC

## 2017-07-08 MED ORDER — ACETAZOLAMIDE SODIUM 500 MG IJ SOLR
500.0000 mg | Freq: Once | INTRAMUSCULAR | Status: AC
Start: 2017-07-08 — End: 2017-07-08
  Administered 2017-07-08: 500 mg via INTRAVENOUS
  Filled 2017-07-08: qty 500

## 2017-07-08 MED ORDER — HYDROCHLOROTHIAZIDE 25 MG PO TABS
25.00 mg | ORAL_TABLET | ORAL | Status: DC
Start: 2017-07-09 — End: 2017-07-08

## 2017-07-08 MED ORDER — LIDOCAINE-EPINEPHRINE (PF) 1 %-1:200000 IJ SOLN
INTRAMUSCULAR | Status: AC
  Filled 2017-07-08: qty 30

## 2017-07-08 MED ORDER — OXYCODONE-ACETAMINOPHEN 5-325 MG PO TABS
1.00 | ORAL_TABLET | ORAL | Status: DC
Start: ? — End: 2017-07-08

## 2017-07-08 NOTE — ED Notes (Signed)
Eye specialist at bedside to perform procedure to reduce pressure
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
## 2017-10-10 ENCOUNTER — Emergency Department
Admission: EM | Admit: 2017-10-10 | Discharge: 2017-10-10 | Disposition: A | Discharge status: Home / Self Care | Attending: Emergency Medicine | Admitting: Emergency Medicine

## 2017-10-10 ENCOUNTER — Other Ambulatory Visit: Payer: Self-pay

## 2017-10-10 DIAGNOSIS — Z79899 Other long term (current) drug therapy: Secondary | ICD-10-CM | POA: Insufficient documentation

## 2017-10-10 DIAGNOSIS — I10 Essential (primary) hypertension: Secondary | ICD-10-CM | POA: Insufficient documentation

## 2017-10-10 DIAGNOSIS — F17200 Nicotine dependence, unspecified, uncomplicated: Secondary | ICD-10-CM | POA: Insufficient documentation

## 2017-10-10 DIAGNOSIS — F1410 Cocaine abuse, uncomplicated: Secondary | ICD-10-CM | POA: Insufficient documentation

## 2017-10-10 LAB — BASIC METABOLIC PANEL
Anion gap: 6 (ref 5–15)
BUN: 22 mg/dL — AB (ref 6–20)
CALCIUM: 9.4 mg/dL (ref 8.9–10.3)
CO2: 24 mmol/L (ref 22–32)
Chloride: 112 mmol/L — ABNORMAL HIGH (ref 101–111)
Creatinine, Ser: 1.45 mg/dL — ABNORMAL HIGH (ref 0.61–1.24)
GFR calc Af Amer: >60 mL/min (ref >60)
GFR, EST NON AFRICAN AMERICAN: 56 mL/min — AB (ref >60)
GLUCOSE: 102 mg/dL — AB (ref 65–99)
Potassium: 3.6 mmol/L (ref 3.5–5.1)
Sodium: 142 mmol/L (ref 135–145)

## 2017-10-10 LAB — CBC WITH DIFFERENTIAL/PLATELET
BASOS ABS: 0.1 10*3/uL (ref 0–0.1)
Basophils Relative: 1 %
EOS PCT: 3 %
Eosinophils Absolute: 0.4 10*3/uL (ref 0–0.7)
HCT: 42.7 % (ref 40.0–52.0)
Hemoglobin: 14.8 g/dL (ref 13.0–18.0)
LYMPHS ABS: 1.3 10*3/uL (ref 1.0–3.6)
LYMPHS PCT: 11 %
MCH: 31.2 pg (ref 26.0–34.0)
MCHC: 34.6 g/dL (ref 32.0–36.0)
MCV: 90.3 fL (ref 80.0–100.0)
MONO ABS: 0.8 10*3/uL (ref 0.2–1.0)
Monocytes Relative: 7 %
Neutro Abs: 9 10*3/uL — ABNORMAL HIGH (ref 1.4–6.5)
Neutrophils Relative %: 78 %
PLATELETS: 308 10*3/uL (ref 150–440)
RBC: 4.73 MIL/uL (ref 4.40–5.90)
RDW: 13.8 % (ref 11.5–14.5)
WBC: 11.6 10*3/uL — ABNORMAL HIGH (ref 3.8–10.6)

## 2017-10-10 LAB — TROPONIN I: Troponin I: <0.03 ng/mL (ref <0.03)

## 2017-10-10 NOTE — ED Triage Notes (Signed)
Pt comes via [facility] with c/o wanting detox from crack. Pt was picked up at parking lot and stated he wanted detox. Pt admits to using crack last night and has used about 3 oz in the last 9 days and hasn't slept. Pt is alert. Pt appears in NAD. VSS

## 2017-10-10 NOTE — ED Notes (Signed)
Pt given food and water

## 2017-10-10 NOTE — ED Provider Notes (Signed)
[facility] Emergency Department Provider Note  ____________________________________________   I have reviewed the triage vital signs and the nursing notes.   HISTORY  Chief Complaint Drug Problem   History limited by: Not Limited   HPI David Ray is a 47 y.o. male who presents to the emergency department today via EMS because of desire for detox. States that he has been abusing cocaine. Last used yesterday. Has long history of cocaine abuse. Apparently police were on scene and patient stated he wanted to come to the hospital for detox. Last attempted detox roughly 3 years ago and was sober for roughly 6 months. Also complaining of depression, denies any suicidal ideation. Stated he was told to follow up with [facility] which he did not do. Complaining of diffuse pain.    Per medical record review patient has a history of cocaine abuse, depression.  Past Medical History:  Diagnosis Date  . Alcoholism (HCC)   . Cocaine abuse (HCC)   . Depression   . Hypertension     There are no active problems to display for this patient.   Past Surgical History:  Procedure Laterality Date  . arm surgery    . FACIAL RECONSTRUCTION SURGERY    . HAND TENDON SURGERY Left     Prior to Admission medications   Medication Sig Start Date End Date Taking? Authorizing Provider  clindamycin (CLEOCIN) 150 MG capsule Take 1 capsule (150 mg total) by mouth every 6 (six) hours. 09/22/12   Zadie Rhine, MD  FLUoxetine HCl (PROZAC PO) Take by mouth daily.    [provider]  hydrochlorothiazide (HYDRODIURIL) 25 MG tablet Take 1 tablet (25 mg total) by mouth daily. 10/04/12   Gilda Crease, MD  hydrocortisone (ANUSOL-HC) 25 MG suppository Place 1 suppository (25 mg total) rectally 2 (two) times daily. 02/24/15   Beers, Charmayne Sheer, PA-C  ibuprofen (ADVIL,MOTRIN) 800 MG tablet Take 1 tablet (800 mg total) by mouth every 8 (eight) hours as needed. 02/24/15   Beers,  Charmayne Sheer, PA-C  oxyCODONE-acetaminophen (ROXICET) 5-325 MG per tablet Take 1-2 tablets by mouth every 4 (four) hours as needed for severe pain. 02/24/15   Beers, Charmayne Sheer, PA-C  traMADol (ULTRAM) 50 MG tablet Take 1 tablet (50 mg total) by mouth every 6 (six) hours as needed for pain. 09/23/12   Hess, Nada Boozer, PA-C  traMADol (ULTRAM) 50 MG tablet Take 1 tablet (50 mg total) by mouth every 6 (six) hours as needed for pain. 10/04/12   Gilda Crease, MD  traZODone (DESYREL) 100 MG tablet Take 1 tablet (100 mg total) by mouth at bedtime. 08/29/12   Gilda Crease, MD  lisinopril (PRINIVIL,ZESTRIL) 5 MG tablet Take 4 tablets (20 mg total) by mouth daily. 08/29/12 10/04/12  Gilda Crease, MD    Allergies Bee pollen and Penicillins  No family history on file.  Social History Social History   Tobacco Use  . Smoking status: Current Every Day Smoker  . Smokeless tobacco: Never Used  Substance Use Topics  . Alcohol use: Yes    Comment: occ  . Drug use: Yes    Types: Cocaine    Review of Systems Constitutional: No fever/chills Eyes: No visual changes. ENT: No sore throat. Cardiovascular: Positive for chest pain. Respiratory: Denies shortness of breath. Gastrointestinal: Positive for abdominal pain. Genitourinary: Negative for dysuria. Musculoskeletal: Positive for extremity pain. Skin: Negative for rash. Neurological: Negative for headaches, focal weakness or numbness.  ____________________________________________   PHYSICAL  EXAM:  VITAL SIGNS: ED Triage Vitals  Enc Vitals Group     BP 10/10/17 1537 (!) 147/99     Pulse Rate 10/10/17 1537 93     Resp 10/10/17 1537 20     Temp 10/10/17 1537 98.4 F (36.9 C)     Temp Source 10/10/17 1537 Oral     SpO2 10/10/17 1537 96 %     Weight 10/10/17 1537 175 lb (79.4 kg)     Height 10/10/17 1537 5\' 8"  (1.727 m)     Head Circumference --      Peak Flow --      Pain Score 10/10/17 1538 0   Constitutional: Awake  and alert. Oriented.  Slightly intoxicated appearing.  Eyes: Conjunctivae are normal.  ENT   Head: Normocephalic and atraumatic.   Nose: No congestion/rhinnorhea.   Mouth/Throat: Mucous membranes are moist.   Neck: No stridor. Hematological/Lymphatic/Immunilogical: No cervical lymphadenopathy. Cardiovascular: Normal rate, regular rhythm.  No murmurs, rubs, or gallops.  Respiratory: Normal respiratory effort without tachypnea nor retractions. Breath sounds are clear and equal bilaterally. No wheezes/rales/rhonchi. Gastrointestinal: Soft and non tender. No rebound. No guarding.  Genitourinary: Deferred Musculoskeletal: Normal range of motion in all extremities. No lower extremity edema. Neurologic:  Normal speech and language. No gross focal neurologic deficits are appreciated.  Skin:  Skin is warm, dry and intact. No rash noted. Psychiatric: Slightly intoxicated appearing.  ____________________________________________    LABS (pertinent positives/negatives)  Trop <0.03 BMP na 142, k 3.6, cr 1.45 CBC wbc 11.6, hgb 14.8, plt 308  ____________________________________________   EKG  I, Phineas SemenGraydon Claudette Wermuth, attending physician, personally viewed and interpreted this EKG  EKG Time: 1548 Rate: 81 Rhythm: normal sinus rhythm Axis: normal Intervals: qtc 436 QRS: narrow, q waves III ST changes: no st elevation, t wave inversion II, III, Avf Impression: abnormal ekg   ____________________________________________    RADIOLOGY  None  ____________________________________________   PROCEDURES  Procedures  ____________________________________________   INITIAL IMPRESSION / ASSESSMENT AND PLAN / ED COURSE  Pertinent labs & imaging results that were available during my care of the patient were reviewed by me and considered in my medical decision making (see chart for details).  Patient presented to the emergency department today requesting detox from cocaine.  He  had also been complaining of diffuse pain as well as some intermittent chest pain.  EKG did show some inverted T waves.  Troponin however was negative.  Patient without any chest pain here in the emergency department.  Did discuss with patient importance of cocaine cessation will give [facility] and [facility] information.  Additionally discussed importance of following up.    ____________________________________________   FINAL CLINICAL IMPRESSION(S) / ED DIAGNOSES  Final diagnoses:  Cocaine abuse (HCC)     Note: This dictation was prepared with Dragon dictation. Any transcriptional errors that result from this process are unintentional     Phineas SemenGoodman, Earnest Thalman, MD 10/10/17 1939

## 2017-10-10 NOTE — ED Notes (Signed)
PT VOLUNTARY 

## 2017-10-10 NOTE — Discharge Instructions (Addendum)
Please seek medical attention for any high fevers, chest pain, shortness of breath, change in behavior, persistent vomiting, bloody stool or any other new or concerning symptoms.  

## 2017-10-10 NOTE — ED Notes (Addendum)
Pt ambulatory with steady gait. Pt given meal tray.

## 2019-05-23 ENCOUNTER — Encounter: Payer: Self-pay | Admitting: Emergency Medicine

## 2019-05-23 ENCOUNTER — Other Ambulatory Visit: Payer: Self-pay

## 2019-05-23 ENCOUNTER — Emergency Department
Admission: EM | Admit: 2019-05-23 | Discharge: 2019-05-23 | Disposition: A | Discharge status: Home / Self Care | Attending: Emergency Medicine | Admitting: Emergency Medicine

## 2019-05-23 ENCOUNTER — Emergency Department

## 2019-05-23 DIAGNOSIS — Z20828 Contact with and (suspected) exposure to other viral communicable diseases: Secondary | ICD-10-CM | POA: Insufficient documentation

## 2019-05-23 DIAGNOSIS — Z79899 Other long term (current) drug therapy: Secondary | ICD-10-CM | POA: Diagnosis not present

## 2019-05-23 DIAGNOSIS — F17200 Nicotine dependence, unspecified, uncomplicated: Secondary | ICD-10-CM | POA: Diagnosis not present

## 2019-05-23 DIAGNOSIS — I10 Essential (primary) hypertension: Secondary | ICD-10-CM | POA: Insufficient documentation

## 2019-05-23 DIAGNOSIS — R05 Cough: Secondary | ICD-10-CM | POA: Diagnosis present

## 2019-05-23 DIAGNOSIS — R0602 Shortness of breath: Secondary | ICD-10-CM | POA: Diagnosis not present

## 2019-05-23 DIAGNOSIS — J069 Acute upper respiratory infection, unspecified: Secondary | ICD-10-CM | POA: Insufficient documentation

## 2019-05-23 LAB — CBC
HCT: 43.7 % (ref 39.0–52.0)
Hemoglobin: 15.4 g/dL (ref 13.0–17.0)
MCH: 30 pg (ref 26.0–34.0)
MCHC: 35.2 g/dL (ref 30.0–36.0)
MCV: 85.2 fL (ref 80.0–100.0)
Platelets: 298 10*3/uL (ref 150–400)
RBC: 5.13 MIL/uL (ref 4.22–5.81)
RDW: 13.2 % (ref 11.5–15.5)
WBC: 10.3 10*3/uL (ref 4.0–10.5)
nRBC: 0 % (ref 0.0–0.2)

## 2019-05-23 LAB — COMPREHENSIVE METABOLIC PANEL
ALT: 34 U/L (ref 0–44)
AST: 31 U/L (ref 15–41)
Albumin: 3.6 g/dL (ref 3.5–5.0)
Alkaline Phosphatase: 57 U/L (ref 38–126)
Anion gap: 9 (ref 5–15)
BUN: 18 mg/dL (ref 6–20)
CO2: 22 mmol/L (ref 22–32)
Calcium: 9.5 mg/dL (ref 8.9–10.3)
Chloride: 107 mmol/L (ref 98–111)
Creatinine, Ser: 1.19 mg/dL (ref 0.61–1.24)
GFR calc Af Amer: >60 mL/min (ref >60)
GFR calc non Af Amer: >60 mL/min (ref >60)
Glucose, Bld: 110 mg/dL — ABNORMAL HIGH (ref 70–99)
Potassium: 3.3 mmol/L — ABNORMAL LOW (ref 3.5–5.1)
Sodium: 138 mmol/L (ref 135–145)
Total Bilirubin: 0.4 mg/dL (ref 0.3–1.2)
Total Protein: 7.2 g/dL (ref 6.5–8.1)

## 2019-05-23 LAB — INFLUENZA PANEL BY PCR (TYPE A & B)
Influenza A By PCR: NEGATIVE
Influenza B By PCR: NEGATIVE

## 2019-05-23 MED ORDER — SODIUM CHLORIDE 0.9 % IV BOLUS
500.0000 mL | Freq: Once | INTRAVENOUS | Status: AC
Start: 2019-05-23 — End: 2019-05-23
  Administered 2019-05-23: 19:00:00 500 mL via INTRAVENOUS

## 2019-05-23 MED ORDER — ACETAMINOPHEN 500 MG PO TABS: 500.0000 mg | ORAL_TABLET | Freq: Four times a day (QID) | ORAL | 0 refills | Status: AC | PRN

## 2019-05-23 MED ORDER — ALBUTEROL SULFATE HFA 108 (90 BASE) MCG/ACT IN AERS: 2.0000 | INHALATION_SPRAY | Freq: Four times a day (QID) | RESPIRATORY_TRACT | 0 refills | Status: AC | PRN

## 2019-05-23 MED ORDER — ONDANSETRON HCL 4 MG/2ML IJ SOLN
4.0000 mg | Freq: Once | INTRAMUSCULAR | Status: AC
  Administered 2019-05-23: 19:00:00 4 mg via INTRAVENOUS
  Filled 2019-05-23: qty 2

## 2019-05-23 MED ORDER — IPRATROPIUM-ALBUTEROL 0.5-2.5 (3) MG/3ML IN SOLN
3.0000 mL | Freq: Once | RESPIRATORY_TRACT | Status: AC
  Administered 2019-05-23: 19:00:00 3 mL via RESPIRATORY_TRACT
  Filled 2019-05-23: qty 3

## 2019-05-23 MED ORDER — POTASSIUM CHLORIDE CRYS ER 20 MEQ PO TBCR
40.0000 meq | EXTENDED_RELEASE_TABLET | Freq: Once | ORAL | Status: AC
  Administered 2019-05-23: 40 meq via ORAL
  Filled 2019-05-23: qty 2

## 2019-05-23 MED ORDER — BENZONATATE 100 MG PO CAPS: 100.0000 mg | ORAL_CAPSULE | Freq: Three times a day (TID) | ORAL | 0 refills | Status: AC | PRN

## 2019-05-23 NOTE — ED Notes (Signed)
Pt c/o cough and not feeling well x3 days. Pt states he has had 2 episodes of emesis in the last 24 hours. Pt denies any diarrhea.

## 2019-05-23 NOTE — Discharge Instructions (Addendum)
There is no pneumonia on your chest x-ray.  Your lab work is all reassuring.  You can take Tessalon Perles for cough.  Take Tylenol for fever and body aches.  You can use albuterol inhaler for any shortness of breath.  Please return the emergency department over the weekend for any worsening of symptoms.  Please call primary care on Monday to establish care.

## 2019-05-23 NOTE — ED Triage Notes (Signed)
Pt presents to ED via POV with c/o fever and cough x 2 days. Pt also c/o general malaise x 2 days. Pt is A&O x4, NAD noted in triage. VSS. Pt c/o coughing up yellow phlegm in the last 2 days.

## 2019-05-23 NOTE — ED Provider Notes (Signed)
[facility] Emergency Department Provider Note  ____________________________________________  Time seen: Approximately 6:12 PM  I have reviewed the triage vital signs and the nursing notes.   HISTORY  Chief Complaint Fever and Cough    HPI David Ray is a 48 y.o. male that presents to the emergency department for evaluation of chills, nasal congestion, sore throat, productive cough with yellow sputum, and shortness of breath, body aches for 2 days. He has had 2 episodes of vomiting. Patient does not have a thermometer so he has not checked his temperature but states that he is burning up and then will have chills.  No sick contacts. He recently got out of prison. No headache,  diarrhea.   Past Medical History:  Diagnosis Date  . Alcoholism (HCC)   . Cocaine abuse (HCC)   . Depression   . Hypertension     There are no active problems to display for this patient.   Past Surgical History:  Procedure Laterality Date  . arm surgery    . FACIAL RECONSTRUCTION SURGERY    . HAND TENDON SURGERY Left     Prior to Admission medications   Medication Sig Start Date End Date Taking? Authorizing Provider  acetaminophen (TYLENOL) 500 MG tablet Take 1 tablet (500 mg total) by mouth every 6 (six) hours as needed. 05/23/19   Enid DerryWagner, Payzlee Ryder, PA-C  albuterol (VENTOLIN HFA) 108 (90 Base) MCG/ACT inhaler Inhale 2 puffs into the lungs every 6 (six) hours as needed for wheezing or shortness of breath. 05/23/19   Enid DerryWagner, Jaivion Kingsley, PA-C  benzonatate (TESSALON PERLES) 100 MG capsule Take 1 capsule (100 mg total) by mouth 3 (three) times daily as needed. 05/23/19 05/22/20  Enid DerryWagner, Ouita Nish, PA-C  clindamycin (CLEOCIN) 150 MG capsule Take 1 capsule (150 mg total) by mouth every 6 (six) hours. 09/22/12   Zadie RhineWickline, Donald, MD  FLUoxetine HCl (PROZAC PO) Take by mouth daily.    [provider]  hydrochlorothiazide (HYDRODIURIL) 25 MG tablet Take 1 tablet (25 mg total) by  mouth daily. 10/04/12   Gilda CreasePollina, Christopher J, MD  hydrocortisone (ANUSOL-HC) 25 MG suppository Place 1 suppository (25 mg total) rectally 2 (two) times daily. 02/24/15   Beers, Charmayne Sheerharles M, PA-C  ibuprofen (ADVIL,MOTRIN) 800 MG tablet Take 1 tablet (800 mg total) by mouth every 8 (eight) hours as needed. 02/24/15   Beers, Charmayne Sheerharles M, PA-C  oxyCODONE-acetaminophen (ROXICET) 5-325 MG per tablet Take 1-2 tablets by mouth every 4 (four) hours as needed for severe pain. 02/24/15   Beers, Charmayne Sheerharles M, PA-C  traMADol (ULTRAM) 50 MG tablet Take 1 tablet (50 mg total) by mouth every 6 (six) hours as needed for pain. 09/23/12   Hess, Nada Boozerobyn M, PA-C  traMADol (ULTRAM) 50 MG tablet Take 1 tablet (50 mg total) by mouth every 6 (six) hours as needed for pain. 10/04/12   Gilda CreasePollina, Christopher J, MD  traZODone (DESYREL) 100 MG tablet Take 1 tablet (100 mg total) by mouth at bedtime. 08/29/12   Gilda CreasePollina, Christopher J, MD    Allergies Bee pollen and Penicillins  No family history on file.  Social History Social History   Tobacco Use  . Smoking status: Current Every Day Smoker  . Smokeless tobacco: Never Used  Substance Use Topics  . Alcohol use: Yes    Comment: occ  . Drug use: Yes    Types: Cocaine     Review of Systems  Constitutional: Positive for chills. Eyes: No visual changes. No discharge. ENT: Positive for  congestion and rhinorrhea. Cardiovascular: No chest pain. Respiratory: Positive for cough and SOB. Gastrointestinal: No abdominal pain.  Positive for nausea and vomiting.  No diarrhea.  No constipation. Musculoskeletal: Positive for body aches. Skin: Negative for rash, abrasions, lacerations, ecchymosis. Neurological: Negative for headaches.   ____________________________________________   PHYSICAL EXAM:  VITAL SIGNS: ED Triage Vitals  Enc Vitals Group     BP 05/23/19 1659 (!) 143/94     Pulse Rate 05/23/19 1659 95     Resp 05/23/19 1659 (!) 22     Temp 05/23/19 1659 99 F (37.2 C)      Temp Source 05/23/19 1659 Oral     SpO2 05/23/19 1659 97 %     Weight 05/23/19 1700 220 lb (99.8 kg)     Height 05/23/19 1700 5\' 8"  (1.727 m)     Head Circumference --      Peak Flow --      Pain Score 05/23/19 1700 6     Pain Loc --      Pain Edu? --      Excl. in [place]? --      Constitutional: Alert and oriented. Well appearing and in no acute distress. Eyes: Conjunctivae are normal. PERRL. EOMI. No discharge. Head: Atraumatic. ENT: No frontal and maxillary sinus tenderness.      Ears: Tympanic membranes pearly gray with good landmarks. No discharge.      Nose: Mild congestion/rhinnorhea.      Mouth/Throat: Mucous membranes are moist. Oropharynx non-erythematous. Tonsils not enlarged. No exudates. Uvula midline. Neck: No stridor.   Hematological/Lymphatic/Immunilogical: No cervical lymphadenopathy. Cardiovascular: Normal rate, regular rhythm.  Good peripheral circulation. Respiratory: Normal respiratory effort without tachypnea or retractions. Lungs CTAB. Good air entry to the bases with no decreased or absent breath sounds. Gastrointestinal: Bowel sounds 4 quadrants. Soft and nontender to palpation. No guarding or rigidity. No palpable masses. No distention. Musculoskeletal: Full range of motion to all extremities. No gross deformities appreciated. Neurologic:  Normal speech and language. No gross focal neurologic deficits are appreciated.  Skin:  Skin is warm, dry and intact. No rash noted. Psychiatric: Mood and affect are normal. Speech and behavior are normal. Patient exhibits appropriate insight and judgement.   ____________________________________________   LABS (all labs ordered are listed, but only abnormal results are displayed)  Labs Reviewed  COMPREHENSIVE METABOLIC PANEL - Abnormal; Notable for the following components:      Result Value   Potassium 3.3 (*)    Glucose, Bld 110 (*)    All other components within normal limits  SARS CORONAVIRUS 2 (TAT 6-24 HRS)   CBC  INFLUENZA PANEL BY PCR (TYPE A & B)   ____________________________________________  EKG   ____________________________________________  RADIOLOGY Robinette Haines, personally viewed and evaluated these images (plain radiographs) as part of my medical decision making, as well as reviewing the written report by the radiologist.  Dg Chest Portable 1 View  Result Date: 05/23/2019 CLINICAL DATA:  Cough and shortness of breath. EXAM: PORTABLE CHEST 1 VIEW COMPARISON:  August 29, 2012 FINDINGS: The heart size and mediastinal contours are within normal limits. Both lungs are clear. The visualized skeletal structures are unremarkable. IMPRESSION: No active disease. Electronically Signed   By: Dorise Bullion III M.D   On: 05/23/2019 17:43    ____________________________________________    PROCEDURES  Procedure(s) performed:    Procedures    Medications  ipratropium-albuterol (DUONEB) 0.5-2.5 (3) MG/3ML nebulizer solution 3 mL (3 mLs Nebulization Given 05/23/19 1849)  sodium  chloride 0.9 % bolus 500 mL (0 mLs Intravenous Stopped 05/23/19 2040)  ondansetron (ZOFRAN) injection 4 mg (4 mg Intravenous Given 05/23/19 1849)  potassium chloride SA (KLOR-CON) CR tablet 40 mEq (40 mEq Oral Given 05/23/19 2009)     ____________________________________________   INITIAL IMPRESSION / ASSESSMENT AND PLAN / ED COURSE  Pertinent labs & imaging results that were available during my care of the patient were reviewed by me and considered in my medical decision making (see chart for details).  Review of the [place] CSRS was performed in accordance of the [facility] prior to dispensing any controlled drugs.     Patient's diagnosis is consistent with viral URI. Vital signs and exam are reassuring.  Chest x-ray negative for acute cardiopulmonary processes.  Lab work all very reassuring.  Potassium was mildly low at 3.3 and was supplemented.  EKG shows normal sinus rhythm.  Influenza test is negative.   Covid test is pending.  Shortness of breath completely resolved with 1 DuoNeb treatment.  Patient appears well and is staying well hydrated.  Patient feels comfortable going home. Patient will be discharged home with prescriptions for albuterol, Tessalon Perles, Tylenol. Patient is to follow up with primary care as needed or otherwise directed. Patient is given ED precautions to return to the ED for any worsening or new symptoms.  David Ray was evaluated in Emergency Department on 05/23/2019 for the symptoms described in the history of present illness. He was evaluated in the context of the global COVID-19 pandemic, which necessitated consideration that the patient might be at risk for infection with the SARS-CoV-2 virus that causes COVID-19. Institutional protocols and algorithms that pertain to the evaluation of patients at risk for COVID-19 are in a state of rapid change based on information released by regulatory bodies including the CDC and federal and state organizations. These policies and algorithms were followed during the patient's care in the ED.   ____________________________________________  FINAL CLINICAL IMPRESSION(S) / ED DIAGNOSES  Final diagnoses:  Viral URI with cough      NEW MEDICATIONS STARTED DURING THIS VISIT:  ED Discharge Orders         Ordered    albuterol (VENTOLIN HFA) 108 (90 Base) MCG/ACT inhaler  Every 6 hours PRN     05/23/19 2034    acetaminophen (TYLENOL) 500 MG tablet  Every 6 hours PRN     05/23/19 2034    benzonatate (TESSALON PERLES) 100 MG capsule  3 times daily PRN     05/23/19 2034              This chart was dictated using voice recognition software/Dragon. Despite best efforts to proofread, errors can occur which can change the meaning. Any change was purely unintentional.    Enid Derry, PA-C 05/23/19 2350    Minna Antis, MD 05/24/19 [id number]

## 2019-05-24 LAB — SARS CORONAVIRUS 2 (TAT 6-24 HRS): SARS Coronavirus 2: NEGATIVE

## 2020-10-04 ENCOUNTER — Other Ambulatory Visit: Payer: Self-pay

## 2020-10-04 ENCOUNTER — Ambulatory Visit
Admission: EM | Admit: 2020-10-04 | Discharge: 2020-10-04 | Disposition: A | Discharge status: Home / Self Care | Payer: Medicaid Other

## 2020-10-04 ENCOUNTER — Ambulatory Visit (INDEPENDENT_AMBULATORY_CARE_PROVIDER_SITE_OTHER): Payer: Medicaid Other

## 2020-10-04 ENCOUNTER — Encounter: Payer: Self-pay | Admitting: Emergency Medicine

## 2020-10-04 DIAGNOSIS — M25512 Pain in left shoulder: Secondary | ICD-10-CM

## 2020-10-04 DIAGNOSIS — I10 Essential (primary) hypertension: Secondary | ICD-10-CM

## 2020-10-04 DIAGNOSIS — S4992XA Unspecified injury of left shoulder and upper arm, initial encounter: Secondary | ICD-10-CM

## 2020-10-04 DIAGNOSIS — S46912A Strain of unspecified muscle, fascia and tendon at shoulder and upper arm level, left arm, initial encounter: Secondary | ICD-10-CM

## 2020-10-04 MED ORDER — IBUPROFEN 800 MG PO TABS: 800.0000 mg | ORAL_TABLET | Freq: Three times a day (TID) | ORAL | 0 refills | Status: AC | PRN

## 2020-10-04 MED ORDER — CYCLOBENZAPRINE HCL 10 MG PO TABS: 10.0000 mg | ORAL_TABLET | Freq: Every evening | ORAL | 0 refills | Status: AC | PRN

## 2020-10-04 MED ORDER — KETOROLAC TROMETHAMINE 60 MG/2ML IM SOLN
60.0000 mg | Freq: Once | INTRAMUSCULAR | Status: AC
  Administered 2020-10-04: 60 mg via INTRAMUSCULAR

## 2020-10-04 NOTE — Discharge Instructions (Addendum)
SPRAIN/STRAIN: Stressed avoiding painful activities . Reviewed RICE guidelines. Use medications as directed, including NSAIDs. If no NSAIDs have been prescribed for you today, you may take Aleve or Motrin over the counter. May use Tylenol in between doses of NSAIDs.  If no improvement in the next 1-2 weeks, f/u with PCP or return to our office for reexamination, and please feel free to call or return at any time for any questions or concerns you may have and we will be happy to help you!      You may have a condition requiring you to follow up with Orthopedics so please call one of the following office for appointment:   [facility] [address] [place], [place] [address] Phone: [phone]  [facility] [address] [place], [place] [address] Phone: [phone]   HYPERTENSION: BP elevated in clinic. Keep log and f/u with PCP for possible medication adjustments.

## 2020-10-04 NOTE — ED Triage Notes (Signed)
Pt is present today with a left shoulder injury. Pt states that he yanked billy goat out the way and when he yanked it out the way he heard a pop in the left shoulder. Pt states that he is not able to rotate his shoulder. Pt states without any shoulder movement his pain is around a 7 ir he moves it the pain shoots to a 10.

## 2020-10-04 NOTE — ED Provider Notes (Signed)
[facility]    CSN: [id number] Arrival date & time: 10/04/20  1533      History   Chief Complaint Chief Complaint  Patient presents with  . Shoulder Injury    HPI David Ray is a 50 y.o. right-handed male presenting for left shoulder pain following an injury today.  Patient states that he was using a piece of equipment and yanked on it.  He says that he felt a sharp pain in his shoulder.  He says that a friend moved his shoulder around when he felt a "pop."  He says that he thinks his shoulder might have been out of socket and his friend put it back into place.  He has had continued pain.  He denies any numbness, weakness or tingling.  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  He has taken Tylenol without relief of the pain.  Patient states that he used to get ibuprofen 800 mg tablets and that helped with his back and hip arthritis.  He is requesting that today.  He is also supposed to work tomorrow and would like a work note.  He has no other injuries, complaints or concerns.  Patient does have history of hypertension and states that he takes lisinopril and HCTZ but may have forgotten to take his medication today.  HPI  Past Medical History:  Diagnosis Date  . Alcoholism (HCC)   . Cocaine abuse (HCC)   . Depression   . Hypertension     There are no problems to display for this patient.   Past Surgical History:  Procedure Laterality Date  . arm surgery    . FACIAL RECONSTRUCTION SURGERY    . HAND TENDON SURGERY Left        Home Medications    Prior to Admission medications   Medication Sig Start Date End Date Taking? Authorizing Provider  cyclobenzaprine (FLEXERIL) 10 MG tablet Take 1 tablet (10 mg total) by mouth at bedtime as needed for up to 10 days for muscle spasms. 10/04/20 10/14/20 Yes Shirlee Latch, PA-C  ibuprofen (ADVIL) 800 MG tablet Take 1  tablet (800 mg total) by mouth every 8 (eight) hours as needed for up to 10 days for moderate pain. 10/04/20 10/14/20 Yes Shirlee Latch, PA-C  acetaminophen (TYLENOL) 500 MG tablet Take 1 tablet (500 mg total) by mouth every 6 (six) hours as needed. 05/23/19   Enid Derry, PA-C  albuterol (VENTOLIN HFA) 108 (90 Base) MCG/ACT inhaler Inhale 2 puffs into the lungs every 6 (six) hours as needed for wheezing or shortness of breath. 05/23/19   Enid Derry, PA-C  clindamycin (CLEOCIN) 150 MG capsule Take 1 capsule (150 mg total) by mouth every 6 (six) hours. 09/22/12   Zadie Rhine, MD  FLUoxetine HCl (PROZAC PO) Take by mouth daily.    [provider]  hydrochlorothiazide (HYDRODIURIL) 25 MG tablet Take 1 tablet (25 mg total) by mouth daily. 10/04/12   Gilda Crease, MD  hydrocortisone (ANUSOL-HC) 25 MG suppository Place 1 suppository (25 mg total) rectally 2 (two) times daily. 02/24/15   Beers, Charmayne Sheer, PA-C  hydrOXYzine (ATARAX/VISTARIL) 50 MG tablet Take 50 mg by mouth at bedtime as needed. 09/21/20   [provider]  lisinopril (ZESTRIL) 10 MG tablet Take 1 tablet by mouth daily. 08/09/20   [provider]  OLANZapine (ZYPREXA) 10 MG tablet Take 10 mg  by mouth at bedtime. 08/06/20   [provider]  oxyCODONE-acetaminophen (ROXICET) 5-325 MG per tablet Take 1-2 tablets by mouth every 4 (four) hours as needed for severe pain. 02/24/15   Beers, Charmayne Sheer, PA-C  PARoxetine (PAXIL) 20 MG tablet Take 20 mg by mouth every morning. 08/06/20   [provider]  prazosin (MINIPRESS) 2 MG capsule Take 2 mg by mouth at bedtime. 08/18/20   [provider]  simvastatin (ZOCOR) 20 MG tablet Take 20 mg by mouth daily. 08/09/20   [provider]  traMADol (ULTRAM) 50 MG tablet Take 1 tablet (50 mg total) by mouth every 6 (six) hours as needed for pain. 09/23/12   Hess, Nada Boozer, PA-C  traMADol (ULTRAM) 50 MG tablet Take 1 tablet (50 mg total) by  mouth every 6 (six) hours as needed for pain. 10/04/12   Gilda Crease, MD  traZODone (DESYREL) 100 MG tablet Take 1 tablet (100 mg total) by mouth at bedtime. 08/29/12   Gilda Crease, MD    Family History History reviewed. No pertinent family history.  Social History Social History   Tobacco Use  . Smoking status: Current Every Day Smoker    Types: Cigarettes  . Smokeless tobacco: Never Used  Substance Use Topics  . Alcohol use: Yes    Comment: occ  . Drug use: Not Currently    Types: Cocaine     Allergies   Bee pollen and Penicillins   Review of Systems Review of Systems  Musculoskeletal: Positive for arthralgias. Negative for back pain, joint swelling and neck pain.  Skin: Negative for color change, rash and wound.  Neurological: Negative for weakness and numbness.     Physical Exam Triage Vital Signs ED Triage Vitals  Enc Vitals Group     BP --      Pulse Rate 10/04/20 1555 83     Resp 10/04/20 1555 18     Temp 10/04/20 1555 98.7 F (37.1 C)     Temp Source 10/04/20 1555 Oral     SpO2 10/04/20 1555 97 %     Weight --      Height --      Head Circumference --      Peak Flow --      Pain Score 10/04/20 1550 7     Pain Loc --      Pain Edu? --      Excl. in [place]? --    No data found.  Updated Vital Signs BP (!) 163/118   Pulse 83   Temp 98.7 F (37.1 C) (Oral)   Resp 18   SpO2 97%       Physical Exam Vitals and nursing note reviewed.  Constitutional:      General: He is not in acute distress.    Appearance: Normal appearance. He is well-developed. He is not ill-appearing.  HENT:     Head: Normocephalic and atraumatic.  Eyes:     General: No scleral icterus.    Conjunctiva/sclera: Conjunctivae normal.  Cardiovascular:     Rate and Rhythm: Normal rate and regular rhythm.     Pulses: Normal pulses.  Pulmonary:     Effort: Pulmonary effort is normal. No respiratory distress.  Musculoskeletal:     Left shoulder: Tenderness  (TTP AC, biceps groove and lateral deltoid) present. No swelling. Decreased range of motion (mildly decreased flexion, abduction and adduction). Normal strength. Normal pulse.     Cervical back: Neck supple.  Skin:  General: Skin is warm and dry.  Neurological:     General: No focal deficit present.     Mental Status: He is alert. Mental status is at baseline.     Motor: No weakness.     Gait: Gait normal.  Psychiatric:        Mood and Affect: Mood normal.        Behavior: Behavior normal.        Thought Content: Thought content normal.      UC Treatments / Results  Labs (all labs ordered are listed, but only abnormal results are displayed) Labs Reviewed - No data to display  EKG   Radiology DG Shoulder Left  Result Date: 10/04/2020 CLINICAL DATA:  Left shoulder injury, limited range of motion EXAM: LEFT SHOULDER - 2+ VIEW COMPARISON:  None. FINDINGS: Frontal, transscapular, and axillary views of the left shoulder are obtained. No fracture, subluxation, or dislocation. Mild hypertrophic changes of the acromioclavicular joint. Glenohumeral joint space is well preserved. Visualized portions of the left chest are clear. IMPRESSION: 1. Mild hypertrophic changes of the acromioclavicular joint. No acute bony abnormality. Electronically Signed   By: Sharlet Salina M.D.   On: 10/04/2020 16:33    Procedures Procedures (including critical care time)  Medications Ordered in UC Medications  ketorolac (TORADOL) injection 60 mg (has no administration in time range)    Initial Impression / Assessment and Plan / UC Course  I have reviewed the triage vital signs and the nursing notes.  Pertinent labs & imaging results that were available during my care of the patient were reviewed by me and considered in my medical decision making (see chart for details).   50-year male presenting for left shoulder pain following injury today.  X-ray of shoulder obtained which does not show any acute  abnormality.  There is mild hyper trophic changes of the [facility] joint, otherwise normal.  Reviewed results with patient.  Advised him that he likely strained or sprained his shoulder.  Patient given 60 mg IM ketorolac in clinic for pain request.  Send prescription for 800 mg ibuprofen and cyclobenzaprine for him to take at night.  Advised he can also take Tylenol if he needs it.  He declined a sling.  I gave him a work note.  Advised him to follow-up with Ortho if not improving over the next week or for any worsening symptoms.  Advised if he is not started to get better he may need an MRI to ensure that he did not significantly tear a ligament.  BP elevated in the clinic today 163/119.  Patient says he did not take his medication.  Advised him to take his medication he gets home and keep a log of his blood pressure and if consistently over 130/80 he needs to follow-up with his PCP about possible medication adjustment.  Final Clinical Impressions(s) / UC Diagnoses   Final diagnoses:  Strain of left shoulder, initial encounter  Acute pain of left shoulder  Essential hypertension     Discharge Instructions     SPRAIN/STRAIN: Stressed avoiding painful activities . Reviewed RICE guidelines. Use medications as directed, including NSAIDs. If no NSAIDs have been prescribed for you today, you may take Aleve or Motrin over the counter. May use Tylenol in between doses of NSAIDs.  If no improvement in the next 1-2 weeks, f/u with PCP or return to our office for reexamination, and please feel free to call or return at any time for any questions or concerns you  may have and we will be happy to help you!      You may have a condition requiring you to follow up with Orthopedics so please call one of the following office for appointment:   [facility] [address], [place], [place] [address] Phone: [phone]  [facility] [address], [place], [place] [address] Phone: [phone]   HYPERTENSION:  BP elevated in clinic. Keep log and f/u with PCP for possible medication adjustments.    ED Prescriptions    Medication Sig Dispense Auth. Provider   ibuprofen (ADVIL) 800 MG tablet Take 1 tablet (800 mg total) by mouth every 8 (eight) hours as needed for up to 10 days for moderate pain. 30 tablet Eusebio FriendlyEaves, Giavanni Zeitlin B, PA-C   cyclobenzaprine (FLEXERIL) 10 MG tablet Take 1 tablet (10 mg total) by mouth at bedtime as needed for up to 10 days for muscle spasms. 10 tablet Gareth MorganEaves, Quanna Wittke B, PA-C     PDMP not reviewed this encounter.   Shirlee Latchaves, Madissen Wyse B, PA-C 10/04/20 1710

## 2021-02-27 ENCOUNTER — Emergency Department
Admission: EM | Admit: 2021-02-27 | Discharge: 2021-02-27 | Disposition: A | Discharge status: Home / Self Care | Payer: Medicaid Other | Attending: Emergency Medicine | Admitting: Emergency Medicine

## 2021-02-27 ENCOUNTER — Other Ambulatory Visit: Payer: Self-pay

## 2021-02-27 DIAGNOSIS — Y92410 Unspecified street and highway as the place of occurrence of the external cause: Secondary | ICD-10-CM | POA: Insufficient documentation

## 2021-02-27 DIAGNOSIS — F10129 Alcohol abuse with intoxication, unspecified: Secondary | ICD-10-CM | POA: Insufficient documentation

## 2021-02-27 DIAGNOSIS — Z5321 Procedure and treatment not carried out due to patient leaving prior to being seen by health care provider: Secondary | ICD-10-CM | POA: Insufficient documentation

## 2021-02-27 DIAGNOSIS — M791 Myalgia, unspecified site: Secondary | ICD-10-CM | POA: Insufficient documentation
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
## 2021-04-12 ENCOUNTER — Encounter: Payer: Self-pay | Admitting: *Deleted

## 2021-04-12 ENCOUNTER — Other Ambulatory Visit: Payer: Self-pay

## 2021-04-12 DIAGNOSIS — F1910 Other psychoactive substance abuse, uncomplicated: Secondary | ICD-10-CM | POA: Insufficient documentation

## 2021-04-12 DIAGNOSIS — R0781 Pleurodynia: Secondary | ICD-10-CM | POA: Insufficient documentation

## 2021-04-12 DIAGNOSIS — Y92410 Unspecified street and highway as the place of occurrence of the external cause: Secondary | ICD-10-CM | POA: Insufficient documentation

## 2021-04-12 DIAGNOSIS — F17210 Nicotine dependence, cigarettes, uncomplicated: Secondary | ICD-10-CM | POA: Insufficient documentation

## 2021-04-12 DIAGNOSIS — I10 Essential (primary) hypertension: Secondary | ICD-10-CM | POA: Insufficient documentation

## 2021-04-12 DIAGNOSIS — M542 Cervicalgia: Secondary | ICD-10-CM | POA: Insufficient documentation

## 2021-04-12 DIAGNOSIS — M791 Myalgia, unspecified site: Secondary | ICD-10-CM | POA: Insufficient documentation

## 2021-04-12 DIAGNOSIS — S0990XA Unspecified injury of head, initial encounter: Secondary | ICD-10-CM | POA: Insufficient documentation

## 2021-04-12 DIAGNOSIS — K829 Disease of gallbladder, unspecified: Secondary | ICD-10-CM | POA: Insufficient documentation

## 2021-04-12 DIAGNOSIS — Z79899 Other long term (current) drug therapy: Secondary | ICD-10-CM | POA: Insufficient documentation

## 2021-04-12 LAB — CBC
HCT: 34.6 % — ABNORMAL LOW (ref 39.0–52.0)
Hemoglobin: 12.5 g/dL — ABNORMAL LOW (ref 13.0–17.0)
MCH: 32.3 pg (ref 26.0–34.0)
MCHC: 36.1 g/dL — ABNORMAL HIGH (ref 30.0–36.0)
MCV: 89.4 fL (ref 80.0–100.0)
Platelets: 296 10*3/uL (ref 150–400)
RBC: 3.87 MIL/uL — ABNORMAL LOW (ref 4.22–5.81)
RDW: 12.4 % (ref 11.5–15.5)
WBC: 14.9 10*3/uL — ABNORMAL HIGH (ref 4.0–10.5)
nRBC: 0 % (ref 0.0–0.2)

## 2021-04-12 LAB — COMPREHENSIVE METABOLIC PANEL
ALT: 35 U/L (ref 0–44)
AST: 47 U/L — ABNORMAL HIGH (ref 15–41)
Albumin: 3.5 g/dL (ref 3.5–5.0)
Alkaline Phosphatase: 52 U/L (ref 38–126)
Anion gap: 9 (ref 5–15)
BUN: 15 mg/dL (ref 6–20)
CO2: 23 mmol/L (ref 22–32)
Calcium: 8.8 mg/dL — ABNORMAL LOW (ref 8.9–10.3)
Chloride: 106 mmol/L (ref 98–111)
Creatinine, Ser: 1.09 mg/dL (ref 0.61–1.24)
GFR, Estimated: >60 mL/min (ref >60)
Glucose, Bld: 89 mg/dL (ref 70–99)
Potassium: 3.2 mmol/L — ABNORMAL LOW (ref 3.5–5.1)
Sodium: 138 mmol/L (ref 135–145)
Total Bilirubin: 0.8 mg/dL (ref 0.3–1.2)
Total Protein: 6.9 g/dL (ref 6.5–8.1)

## 2021-04-12 LAB — ETHANOL: Alcohol, Ethyl (B): <10 mg/dL (ref <10)
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
## 2021-04-13 ENCOUNTER — Emergency Department
Admission: EM | Admit: 2021-04-13 | Discharge: 2021-04-13 | Disposition: A | Discharge status: Home / Self Care | Payer: Medicaid Other | Attending: Emergency Medicine | Admitting: Emergency Medicine

## 2021-04-13 ENCOUNTER — Emergency Department: Payer: Medicaid Other

## 2021-04-13 DIAGNOSIS — F1910 Other psychoactive substance abuse, uncomplicated: Secondary | ICD-10-CM

## 2021-04-13 LAB — URINE DRUG SCREEN, QUALITATIVE (ARMC ONLY)
Amphetamines, Ur Screen: NOT DETECTED
Barbiturates, Ur Screen: NOT DETECTED
Benzodiazepine, Ur Scrn: NOT DETECTED
Cannabinoid 50 Ng, Ur ~~LOC~~: NOT DETECTED
Cocaine Metabolite,Ur ~~LOC~~: POSITIVE — AB
Methadone Scn, Ur: NOT DETECTED
Opiate, Ur Screen: NOT DETECTED
Phencyclidine (PCP) Ur S: NOT DETECTED
Tricyclic, Ur Screen: NOT DETECTED

## 2021-04-13 MED ORDER — IOHEXOL 300 MG/ML  SOLN
100.0000 mL | Freq: Once | INTRAMUSCULAR | Status: AC | PRN
  Administered 2021-04-13: 100 mL via INTRAVENOUS
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
## 2021-04-13 NOTE — ED Notes (Signed)
Pt requesting to use bathroom, urinal provided and UDS obtained and sent to lab.
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
